# Patient Record
Sex: Female | Born: 1935 | Race: White | Hispanic: No | Smoking: Never smoker
Health system: Southern US, Community
[De-identification: ages and names within clinical notes are randomized; demographics above are authoritative.]

## PROBLEM LIST (undated history)

## (undated) HISTORY — PX: CHOLECYSTECTOMY: SHX55

---

## 2016-12-17 ENCOUNTER — Emergency Department (INDEPENDENT_AMBULATORY_CARE_PROVIDER_SITE_OTHER): Payer: Medicare Other

## 2016-12-17 ENCOUNTER — Emergency Department (INDEPENDENT_AMBULATORY_CARE_PROVIDER_SITE_OTHER)
Admission: EM | Admit: 2016-12-17 | Discharge: 2016-12-17 | Disposition: A | Discharge status: Home / Self Care | Payer: Medicare Other | Source: Home / Self Care | Attending: Family Medicine | Admitting: Family Medicine

## 2016-12-17 DIAGNOSIS — S60222A Contusion of left hand, initial encounter: Secondary | ICD-10-CM | POA: Diagnosis not present

## 2016-12-17 DIAGNOSIS — M7989 Other specified soft tissue disorders: Secondary | ICD-10-CM | POA: Diagnosis not present

## 2016-12-17 NOTE — ED Triage Notes (Signed)
Pt noticed about an hour ago that she had bruising and swelling of the left hand between the wrist and joints.  Denies and injury.  Good ROM.  Has used ice.

## 2016-12-17 NOTE — Discharge Instructions (Signed)
Wear ace wrap until swelling resolves.  Elevate hand.  May take ibuprofen as needed for pain.

## 2016-12-17 NOTE — ED Provider Notes (Signed)
[facility]    CSN: [id number] Arrival date & time: 12/17/16  1835     History   Chief Complaint Chief Complaint  Patient presents with  . Hand Problem    HPI Tiffany Henson is a 83 y.o. female.   Patient was doing yoga about 3 hours ago when she noticed painless swelling on the dorsum of her left hand that has persisted.  She recalls no injury.   The history is provided by the patient and a relative.  Hand Injury  Location:  Hand Hand location:  Dorsum of L hand Injury: no   Pain details:    Quality:  Pressure   Radiates to:  Does not radiate   Severity:  Mild   Onset quality:  Sudden   Duration:  3 hours   Timing:  Constant   Progression:  Worsening Dislocation: no   Foreign body present:  No foreign bodies Prior injury to area:  No Worsened by:  Nothing Ineffective treatments:  None tried Associated symptoms: swelling   Associated symptoms: no decreased range of motion, no muscle weakness, no numbness and no tingling     History reviewed. No pertinent past medical history.  There are no active problems to display for this patient.   Past Surgical History:  Procedure Laterality Date  . CHOLECYSTECTOMY      OB History    No data available       Home Medications    Prior to Admission medications   Medication Sig Start Date End Date Taking? Authorizing Provider  ALPRAZolam Prudy Feeler) 0.5 MG tablet Take 0.5 mg by mouth at bedtime as needed for anxiety.   Yes [provider]  aspirin 81 MG chewable tablet Chew by mouth daily.   Yes [provider]  metoprolol tartrate (LOPRESSOR) 25 MG tablet Take 25 mg by mouth 2 (two) times daily.   Yes [provider]  traZODone (DESYREL) 100 MG tablet Take 100 mg by mouth at bedtime.   Yes [provider]    Family History History reviewed. No pertinent family history.  Social History Social History  Substance Use Topics  . Smoking status: Never Smoker  .  Smokeless tobacco: Not on file  . Alcohol use No     Allergies   Patient has no known allergies.   Review of Systems Review of Systems  All other systems reviewed and are negative.    Physical Exam Triage Vital Signs ED Triage Vitals  Enc Vitals Group     BP 12/17/16 1855 (!) 153/70     Pulse Rate 12/17/16 1855 85     Resp --      Temp 12/17/16 1855 98 F (36.7 C)     Temp Source 12/17/16 1855 Oral     SpO2 12/17/16 1855 96 %     Weight 12/17/16 1856 123 lb (55.8 kg)     Height 12/17/16 1856 4\' 9"  (1.448 m)     Head Circumference --      Peak Flow --      Pain Score 12/17/16 1856 3     Pain Loc --      Pain Edu? --      Excl. in GC? --    No data found.   Updated Vital Signs BP (!) 153/70 (BP Location: Right Arm)   Pulse 85   Temp 98 F (36.7 C) (Oral)   Ht 4\' 9"  (1.448 m)   Wt 123 lb (55.8 kg)  SpO2 96%   BMI 26.62 kg/m   Visual Acuity Right Eye Distance:   Left Eye Distance:   Bilateral Distance:    Right Eye Near:   Left Eye Near:    Bilateral Near:     Physical Exam  Constitutional: She appears well-developed and well-nourished. No distress.  HENT:  Head: Atraumatic.  Eyes: Pupils are equal, round, and reactive to light.  Cardiovascular: Normal rate.   Pulmonary/Chest: Effort normal.  Musculoskeletal:       Hands: Hematoma dorsum left hand as noted on diagram, mildly tender to palpation.  No erythema or warmth.  Distal neurovascular function is intact.  Neurological: She is alert.  Skin: Skin is warm and dry.  Nursing note and vitals reviewed.    UC Treatments / Results  Labs (all labs ordered are listed, but only abnormal results are displayed) Labs Reviewed - No data to display  EKG  EKG Interpretation None       Radiology Dg Hand Complete Left  Result Date: 12/17/2016 CLINICAL DATA:  83 year old female with left hand swelling and bruising acute onset in the lateral metatarsal region. No known injury. EXAM: LEFT HAND -  COMPLETE 3+ VIEW COMPARISON:  None. FINDINGS: Bone mineralization is within normal limits for age. Distal radius and ulna appear intact. Radiocarpal joint space loss. Chondrocalcinosis. Carpal bones appear intact and normally aligned. Metacarpals appear intact. MCP and IP joint spaces and alignment appear normal for age. No phalanx fracture identified. No radiopaque foreign body identified. No subcutaneous gas. IMPRESSION: No acute osseous abnormality identified. Mild/age appropriate degenerative changes in the left hand and wrist. Electronically Signed   By: Odessa Fleming M.D.   On: 12/17/2016 19:14    Procedures Procedures (including critical care time)  Medications Ordered in UC Medications - No data to display   Initial Impression / Assessment and Plan / UC Course  I have reviewed the triage vital signs and the nursing notes.  Pertinent labs & imaging results that were available during my care of the patient were reviewed by me and considered in my medical decision making (see chart for details).    Suspect ruptured vein dorsum hand.  Ace wrap applied. Wear ace wrap until swelling resolves.  Elevate hand.  May take ibuprofen as needed for pain. Followup with Family Doctor if not improved in one week.     Final Clinical Impressions(s) / UC Diagnoses   Final diagnoses:  Paroxysmal hematoma of hand, left, initial encounter    New Prescriptions New Prescriptions   No medications on file       Lattie Haw, MD 12/23/16 [phone]

## 2018-01-17 IMAGING — DX DG HAND COMPLETE 3+V*L*
3 series · 3 of 3 positions shown · non-contrast
Comparison: None.

CLINICAL DATA: [AGE] female with left hand swelling and
bruising acute onset in the lateral metatarsal region. No known
injury.

EXAM:
LEFT HAND - COMPLETE 3+ VIEW

[hand pa]
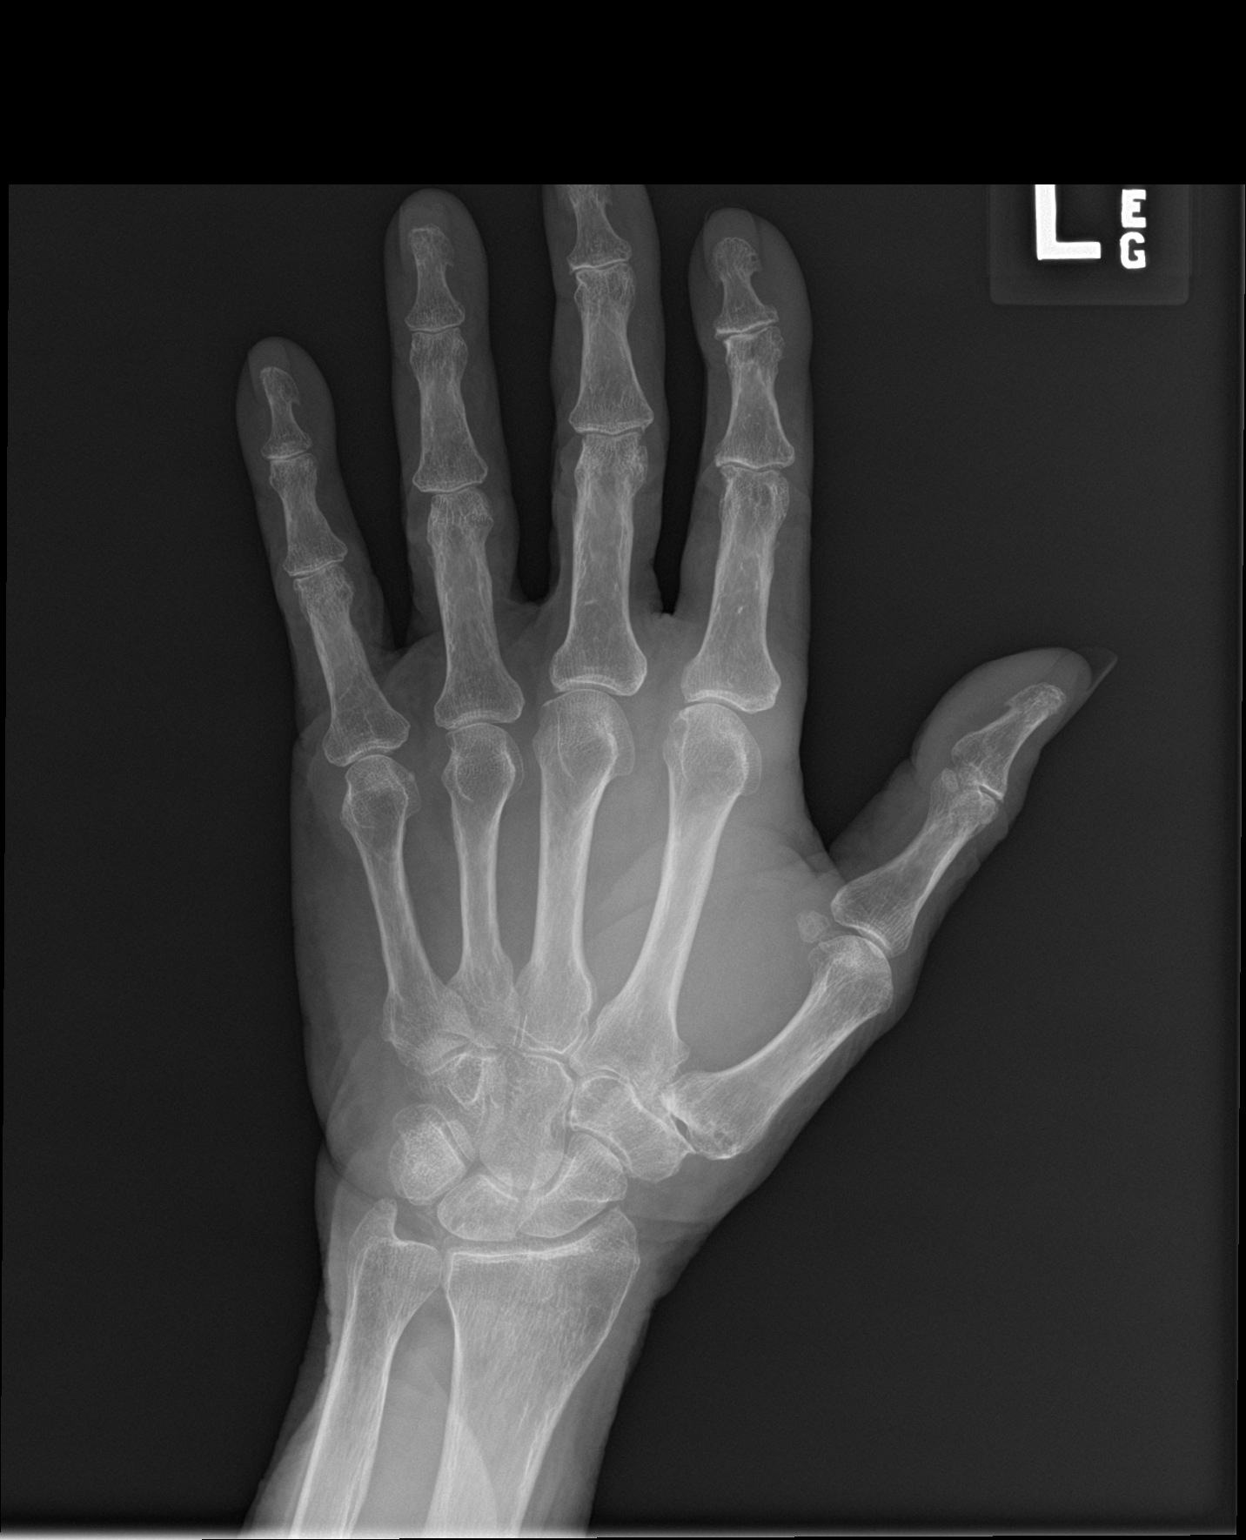

[hand obl]
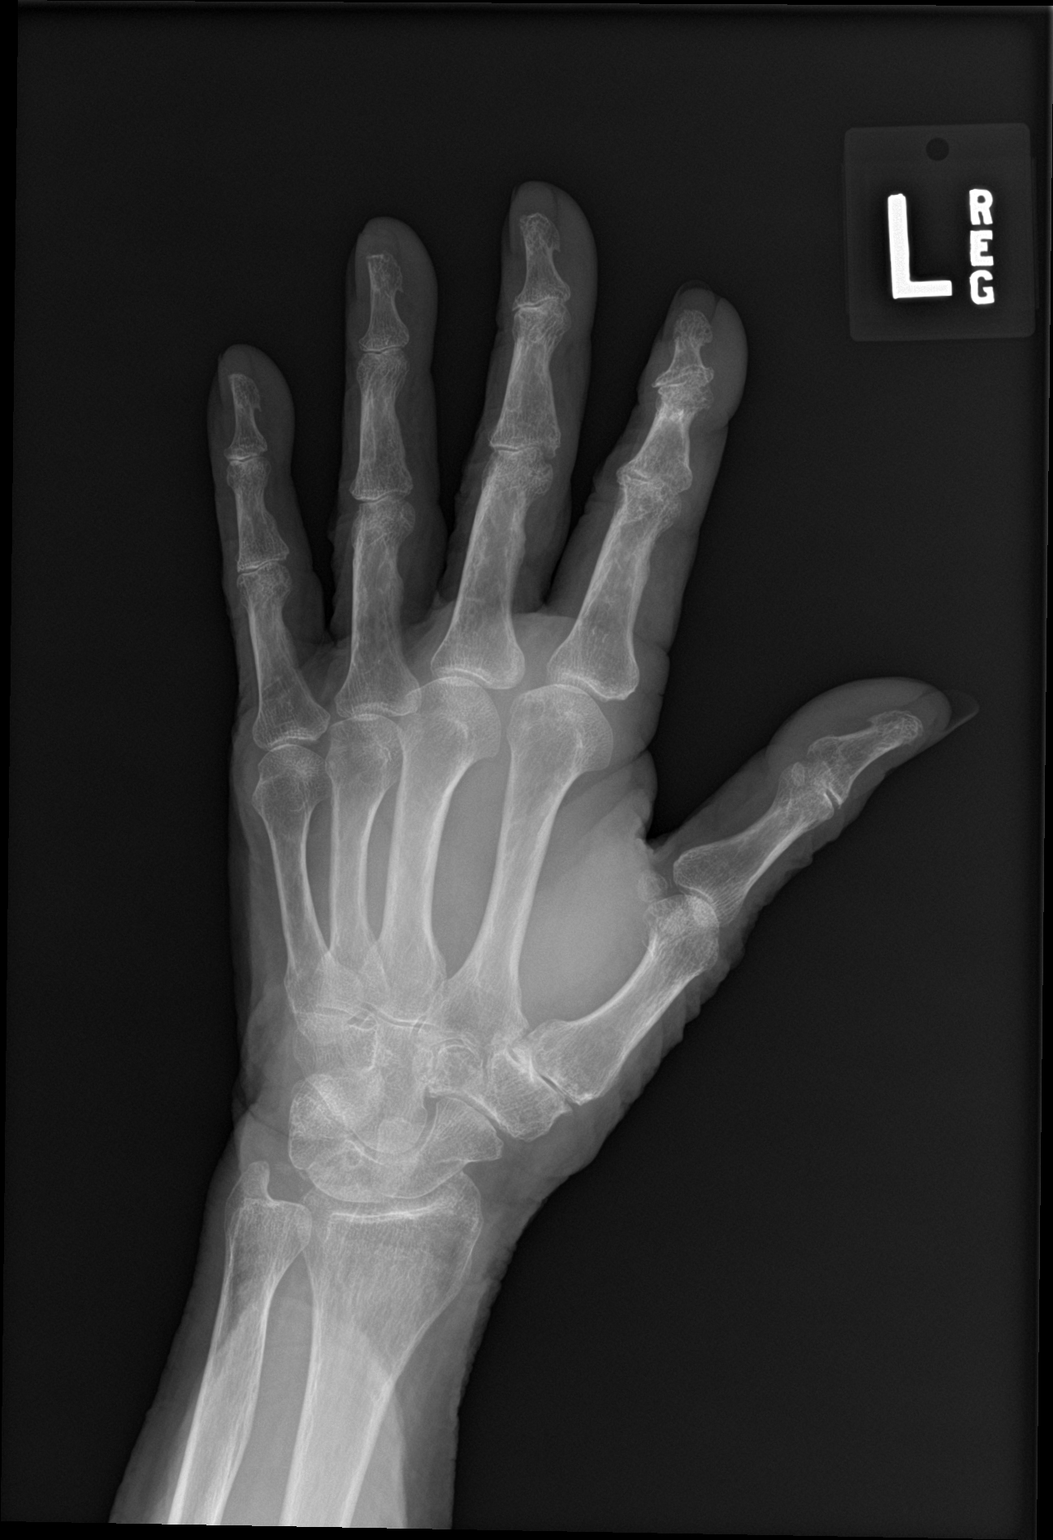

[hand lat]
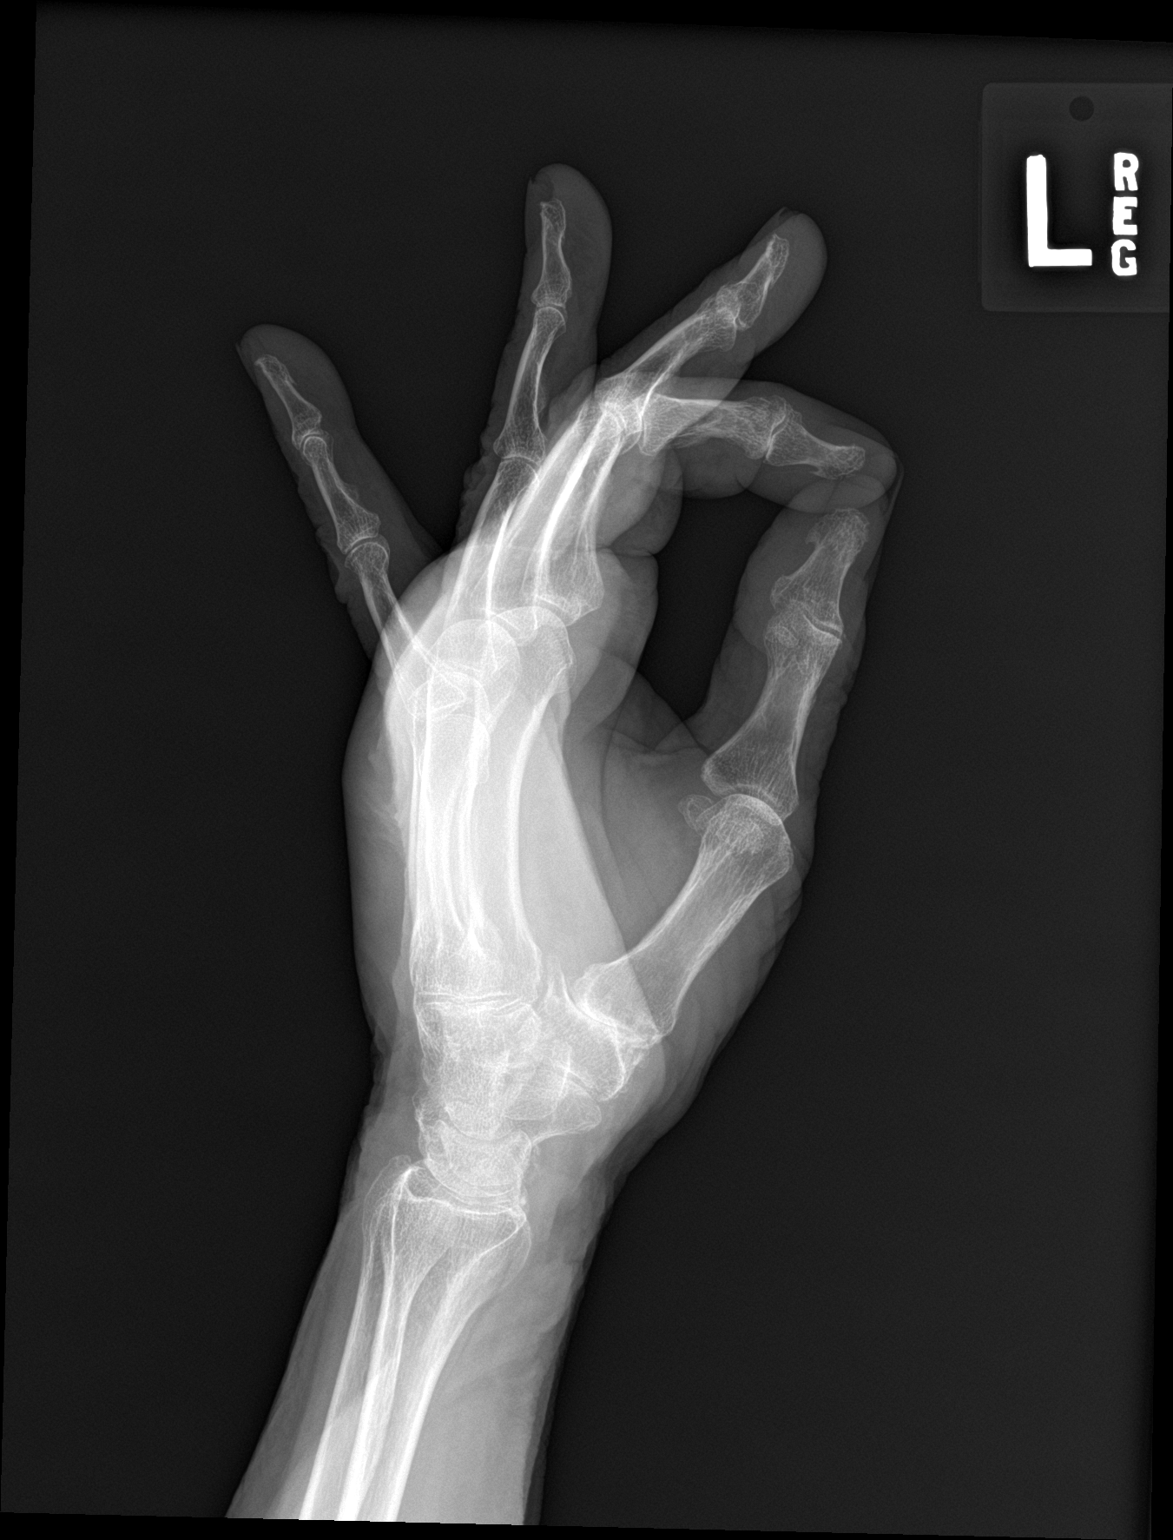

[3 of 3 positions shown; findings below may reference images not displayed]

FINDINGS: Bone mineralization is within normal limits for age. Distal radius
and ulna appear intact. Radiocarpal joint space loss.
Chondrocalcinosis. Carpal bones appear intact and normally aligned.
Metacarpals appear intact. MCP and IP joint spaces and alignment
appear normal for age. No phalanx fracture identified. No radiopaque
foreign body identified. No subcutaneous gas.
IMPRESSION: No acute osseous abnormality identified. Mild/age appropriate
degenerative changes in the left hand and wrist.

## 2019-01-21 DEATH — deceased
